# Patient Record
Sex: Female | Born: 1950 | Race: White | Marital: Married
Health system: Southern US, Community
[De-identification: ages and names within clinical notes are randomized; demographics above are authoritative.]

---

## 2011-09-21 ENCOUNTER — Other Ambulatory Visit: Payer: Self-pay | Admitting: Orthopaedic Surgery

## 2011-09-21 DIAGNOSIS — M549 Dorsalgia, unspecified: Secondary | ICD-10-CM

## 2011-09-24 ENCOUNTER — Ambulatory Visit
Admission: RE | Admit: 2011-09-24 | Discharge: 2011-09-24 | Disposition: A | Discharge status: Home / Self Care | Payer: Medicare Other | Source: Ambulatory Visit | Attending: Orthopaedic Surgery | Admitting: Orthopaedic Surgery

## 2011-09-24 DIAGNOSIS — M549 Dorsalgia, unspecified: Secondary | ICD-10-CM

## 2013-09-25 ENCOUNTER — Other Ambulatory Visit: Payer: Self-pay | Admitting: Orthopaedic Surgery

## 2013-09-25 DIAGNOSIS — M545 Low back pain, unspecified: Secondary | ICD-10-CM

## 2013-09-27 ENCOUNTER — Ambulatory Visit
Admission: RE | Admit: 2013-09-27 | Discharge: 2013-09-27 | Disposition: A | Discharge status: Home / Self Care | Payer: Medicare Other | Source: Ambulatory Visit | Attending: Orthopaedic Surgery | Admitting: Orthopaedic Surgery

## 2013-09-27 DIAGNOSIS — M545 Low back pain, unspecified: Secondary | ICD-10-CM

## 2018-12-19 ENCOUNTER — Other Ambulatory Visit: Payer: Self-pay | Admitting: Orthopaedic Surgery

## 2018-12-19 DIAGNOSIS — M47818 Spondylosis without myelopathy or radiculopathy, sacral and sacrococcygeal region: Secondary | ICD-10-CM

## 2019-01-10 ENCOUNTER — Other Ambulatory Visit: Payer: Medicare Other

## 2019-11-20 ENCOUNTER — Other Ambulatory Visit: Payer: Self-pay | Admitting: Rehabilitation

## 2019-11-20 DIAGNOSIS — M47818 Spondylosis without myelopathy or radiculopathy, sacral and sacrococcygeal region: Secondary | ICD-10-CM

## 2019-12-08 ENCOUNTER — Ambulatory Visit
Admission: RE | Admit: 2019-12-08 | Discharge: 2019-12-08 | Disposition: A | Discharge status: Home / Self Care | Payer: Medicare HMO | Source: Ambulatory Visit | Attending: Rehabilitation | Admitting: Rehabilitation

## 2019-12-08 ENCOUNTER — Other Ambulatory Visit: Payer: Medicare Other

## 2019-12-08 ENCOUNTER — Other Ambulatory Visit: Payer: Self-pay

## 2019-12-08 DIAGNOSIS — M47818 Spondylosis without myelopathy or radiculopathy, sacral and sacrococcygeal region: Secondary | ICD-10-CM

## 2019-12-08 MED ORDER — GADOBENATE DIMEGLUMINE 529 MG/ML IV SOLN
17.0000 mL | Freq: Once | INTRAVENOUS | Status: AC | PRN
  Administered 2019-12-08: 17 mL via INTRAVENOUS

## 2021-06-09 IMAGING — MR MR LUMBAR SPINE WO/W CM
4 of 7 series · 27 of 48 positions shown · IV contrast (multihance)
Comparison: 09/27/2013

CLINICAL DATA: Low back, left buttock,and leg pain

EXAM:
MRI LUMBAR SPINE WITHOUT AND WITH CONTRAST
TECHNIQUE: Multiplanar and multiecho pulse sequences of the lumbar spine were
obtained without and with intravenous contrast.
CONTRAST:  17mL MULTIHANCE GADOBENATE DIMEGLUMINE 529 MG/ML IV SOLN

[Series 3: T1 · sagittal · 4.0mm · 0.55mm/px · 3 of 13 slices shown (1 of 2)]
[im 1/13]
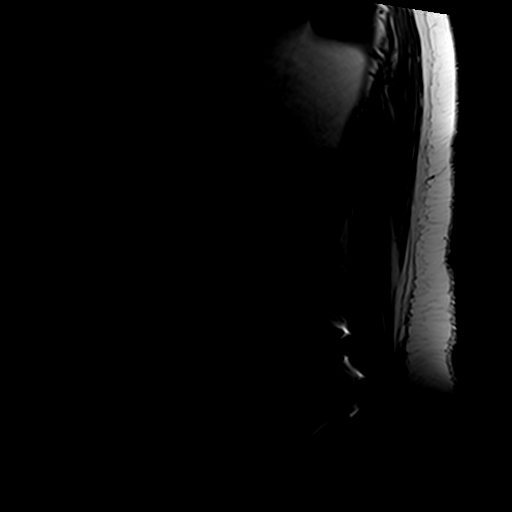
[im 7/13]
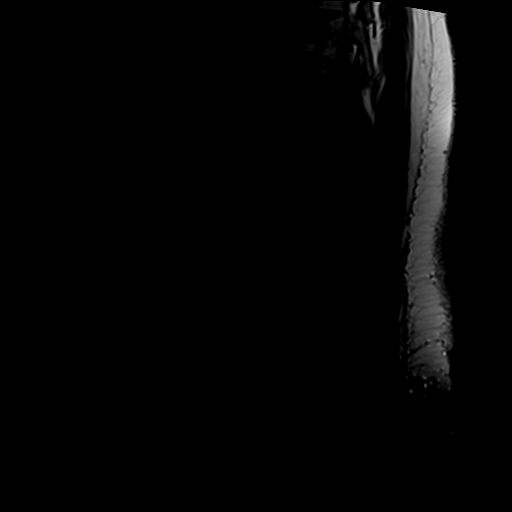
[im 13/13]
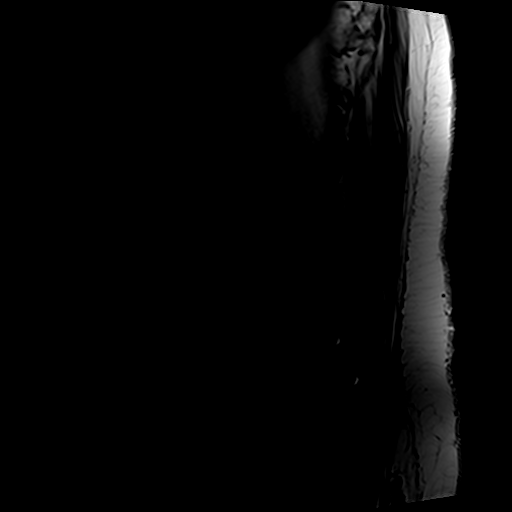

[Series 5: T2 · axial · 4.0mm · 0.70mm/px · z∈[-82,+109]mm · 11 of 34 slices shown (1 of 2)]
[im 1/34]
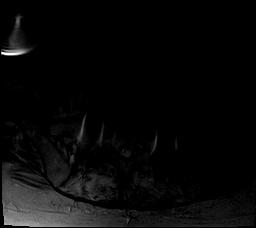
[im 4/34]
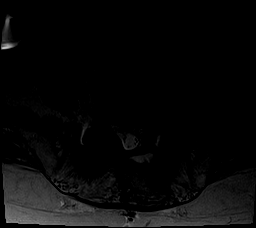
[im 7/34]
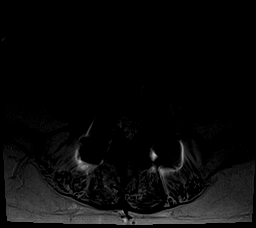
[im 10/34]
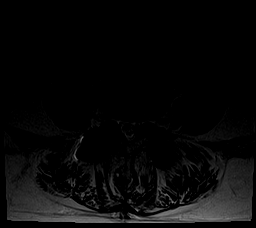
[im 14/34]
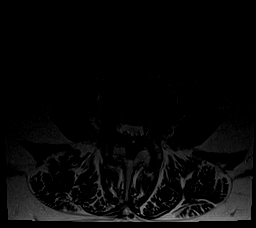
[im 17/34]
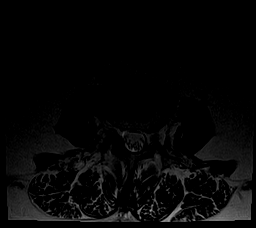
[im 20/34]
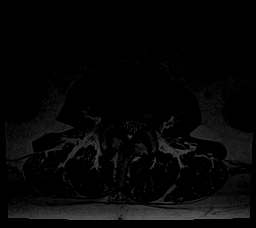
[im 24/34]
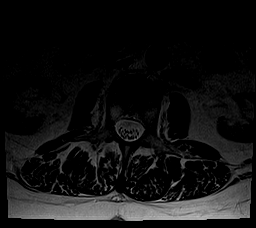
[im 27/34]
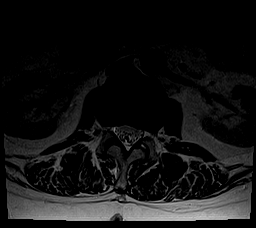
[im 30/34]
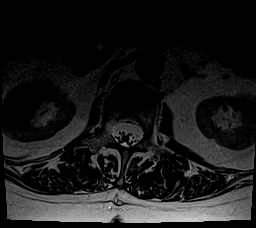
[im 34/34]
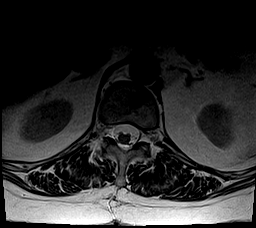

[Series 6: T1 · axial · 4.0mm · 0.35mm/px · z∈[-82,+88]mm · 9 of 34 slices shown (2 of 2)]
[im 1/34]
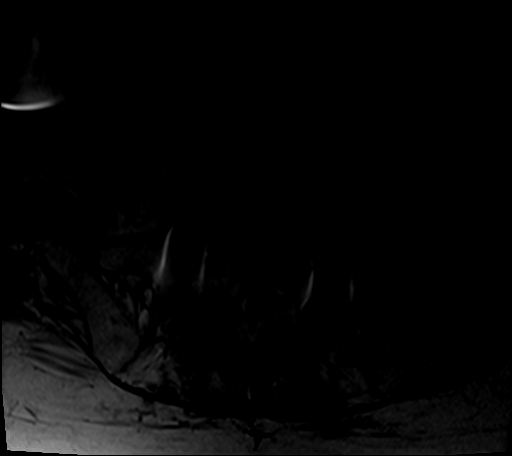
[im 4/34]
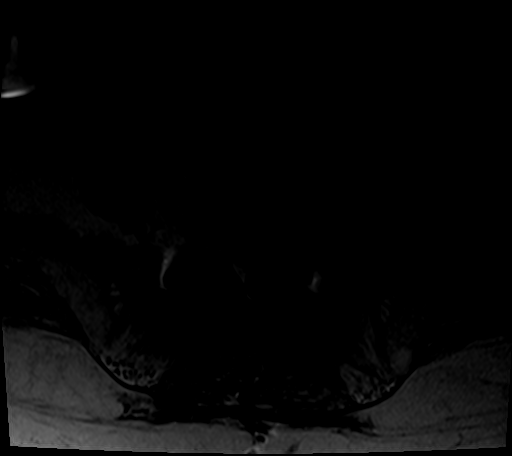
[im 7/34]
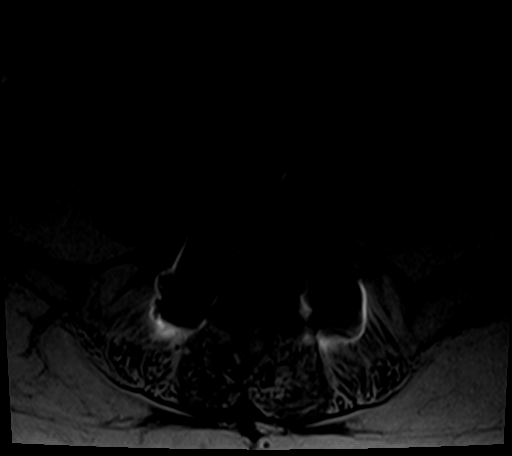
[im 10/34]
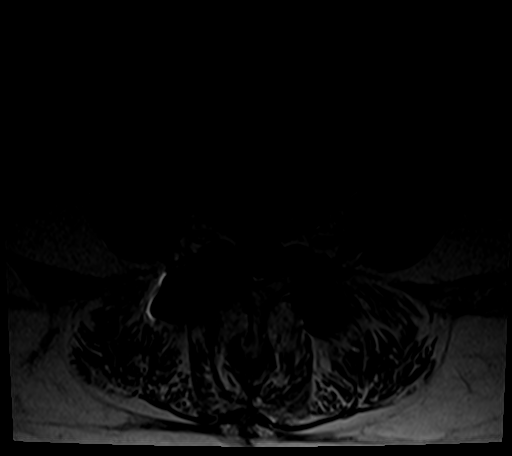
[im 14/34]
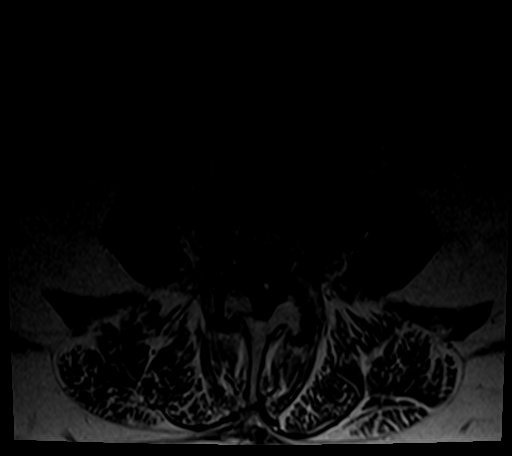
[im 17/34]
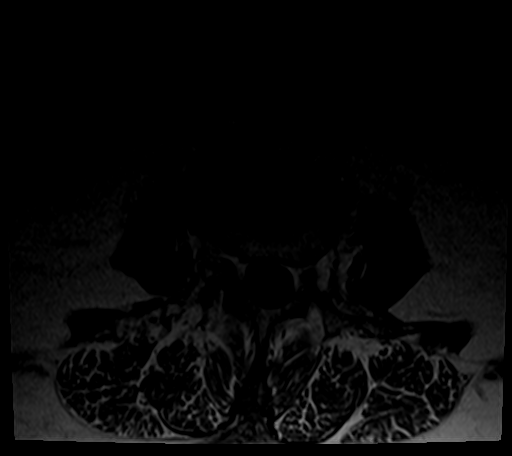
[im 20/34]
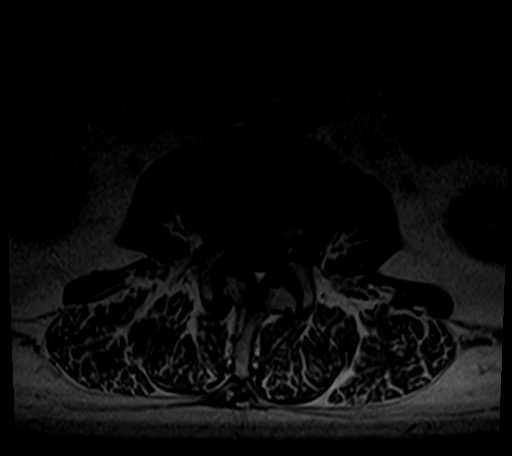
[im 24/34]
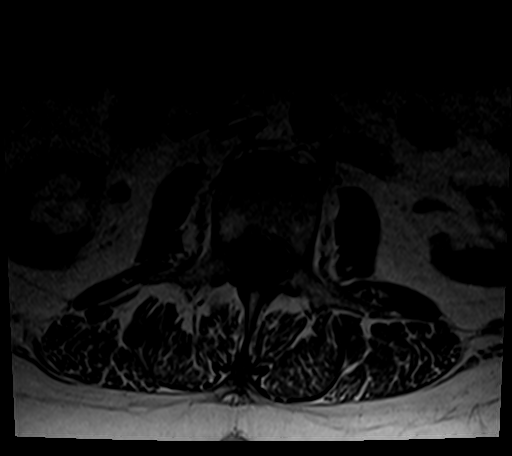
[im 30/34]
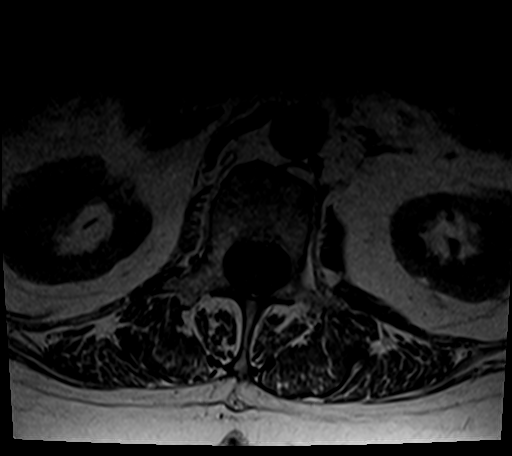

[Series 7: T2 · sagittal · 4.0mm · 0.55mm/px · 4 of 13 slices shown (2 of 2)]
[im 1/13]
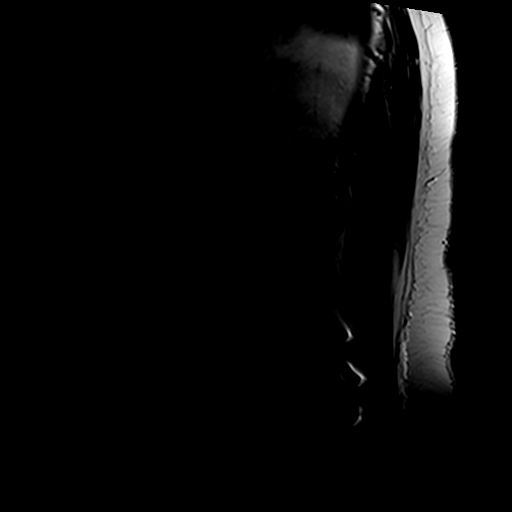
[im 5/13]
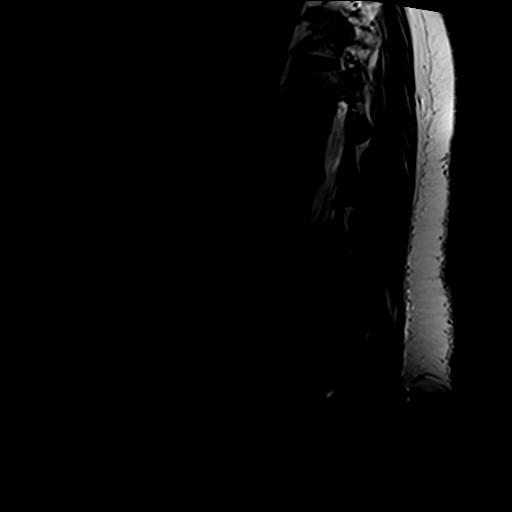
[im 9/13]
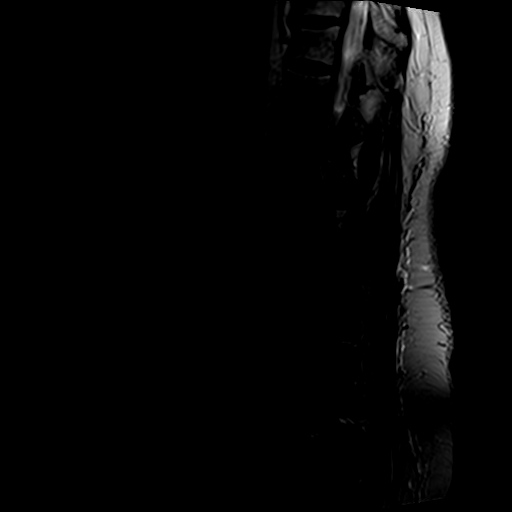
[im 13/13]
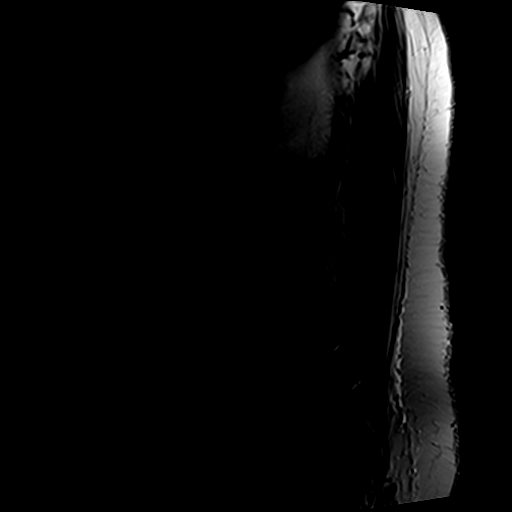

[27 of 48 positions shown; findings below may reference images not displayed]

FINDINGS: Segmentation:  Standard.

Alignment:  Stable.

Vertebrae: Postoperative changes of fusion are again identified at
L5-S1 with rods and pedicle screws and interbody spacer. Hardware is
not well evaluated on this study and there is some associated
susceptibility artifact. No significant marrow edema. No suspicious
osseous lesion. Stable vertebral body heights with prominent
Schmorl's nodes.

Conus medullaris and cauda equina: Conus extends to the L1-L2 level.
Conus and cauda equina appear normal.

Paraspinal and other soft tissues: Chronic postoperative changes.

Disc levels:

L1-L2: Disc bulge with small endplate osteophytes. No canal or
foraminal stenosis. Appearance is similar.

L2-L3: Disc bulge. Mild facet arthropathy. No canal stenosis. Minor
right foraminal stenosis. Appearance is similar.

L3-L4: Disc bulge slightly eccentric to the left with small endplate
osteophytes. Moderate facet arthropathy with ligamentum flavum
infolding. Minor canal stenosis. Mild foraminal stenosis. Appearance
is similar.

L4-L5: Disc bulge with small endplate osteophytes. Moderate to
marked facet arthropathy with ligamentum flavum infolding. Increased
mild to moderate canal stenosis. Partial effacement of the
subarticular recesses. No right foraminal stenosis. Similar mild
left foraminal stenosis.

L5-S1: Operative level. Posterior decompression. Endplate
osteophytes and facet arthropathy. Postoperative enhancing tissue in
left subarticular recess about the traversing S1 nerve root. No
canal stenosis. The foramina are distorted by artifact with probable
mild stenosis on the left. Appearance is similar.
IMPRESSION: Postoperative and multilevel degenerative changes as detailed above.
Increased canal narrowing at L4-L5 with partial effacement of
subarticular recesses. Postoperative enhancing tissue in the left
subarticular recess at L5-S1 about the traversing S1 nerve root.

## 2021-11-10 ENCOUNTER — Other Ambulatory Visit: Payer: Self-pay | Admitting: Orthopaedic Surgery

## 2021-11-10 DIAGNOSIS — M4326 Fusion of spine, lumbar region: Secondary | ICD-10-CM

## 2021-11-10 DIAGNOSIS — I82402 Acute embolism and thrombosis of unspecified deep veins of left lower extremity: Secondary | ICD-10-CM
